# Patient Record
Sex: Male | Born: 1955 | Race: Black or African American | Hispanic: No | Marital: Married | Smoking: Never smoker
Health system: Southern US, Community
[De-identification: ages and names within clinical notes are randomized; demographics above are authoritative.]

## PROBLEM LIST (undated history)

## (undated) DIAGNOSIS — I517 Cardiomegaly: Secondary | ICD-10-CM

## (undated) DIAGNOSIS — R350 Frequency of micturition: Secondary | ICD-10-CM

## (undated) DIAGNOSIS — I10 Essential (primary) hypertension: Secondary | ICD-10-CM

## (undated) DIAGNOSIS — T7840XA Allergy, unspecified, initial encounter: Secondary | ICD-10-CM

## (undated) HISTORY — DX: Frequency of micturition: R35.0

## (undated) HISTORY — PX: COLONOSCOPY: SHX174

## (undated) HISTORY — DX: Allergy, unspecified, initial encounter: T78.40XA

## (undated) HISTORY — DX: Cardiomegaly: I51.7

---

## 2006-06-06 ENCOUNTER — Ambulatory Visit: Payer: Self-pay | Admitting: Gastroenterology

## 2006-06-24 ENCOUNTER — Ambulatory Visit: Payer: Self-pay | Admitting: Gastroenterology

## 2006-10-28 ENCOUNTER — Emergency Department (HOSPITAL_COMMUNITY): Admission: EM | Admit: 2006-10-28 | Discharge: 2006-10-29 | Payer: Self-pay | Admitting: Emergency Medicine
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
## 2008-01-10 ENCOUNTER — Emergency Department (HOSPITAL_COMMUNITY): Admission: EM | Admit: 2008-01-10 | Discharge: 2008-01-11 | Payer: Self-pay | Admitting: Emergency Medicine

## 2011-03-02 NOTE — Letter (Signed)
June 06, 2006     Ora Salvetti  [address] [place]. [place], [place]  [address]   RE:  [name], Drew  MRN:  [id number]  /  DOB:  July 02, 1956   Dear [name];   It is my pleasure to have treated you recently as a new patient in my  office.  I appreciate your confidence and the opportunity to participate in  your care.   Since I do have a busy inpatient endoscopy schedule and office schedule, my  office hours vary weekly.  I am, however, available for emergency calls  every day through my office.  If I cannot promptly meet an urgent office  appointment, another one of our gastroenterologists will be able to assist  you.   My well-trained staff are prepared to help you at all times.  For  emergencies after office hours, a physician from our gastroenterology  section is always available through my 24-hour answering service.   While you are under my care, I encourage discussion of your questions and  concerns, and I will be happy to return your calls as soon as I am  available.   Once again, I welcome you as a new patient and I look forward to a happy and  healthy relationship.   Sincerely,     Barbette Hair. Arlyce Dice, MD, [facility]   RDK/MedQ  DD:  06/06/2006 DT:  06/06/2006 Job #:  [id number]

## 2011-03-02 NOTE — Assessment & Plan Note (Signed)
[facility]                           GASTROENTEROLOGY OFFICE NOTE   ELIYOHU, CLASS                           MRN:          [id number]  DATE:06/06/2006                            DOB:          01/29/1956    REASON FOR CONSULTATION:  Rectal bleeding.   HISTORY:  Mr. Hilgert is a pleasant 55 year old African male referred through  the courtesy of Dr. Wylene Simmer for evaluation.  About three months ago, he had  approximately 10 days of rectal bleeding, consisting of bright red blood  mixed with the stools.  He had no abdominal or rectal pain at the time.  He  has had no recurrences.  He denies change of bowel habits or melena.   PAST MEDICAL HISTORY:  Pertinent for hypertension.   FAMILY HISTORY:  Noncontributory.   MEDICATIONS:  Toprol XL and Benicar.   ALLERGIES:  No known allergies.   SOCIAL HISTORY:  He does not smoke.  He drinks rarely.  He is single and  works at [employer].   REVIEW OF SYSTEMS:  Reviewed and is negative.   PHYSICAL EXAMINATION:  VITAL SIGNS:  Pulse 80, blood pressure 140/82, weight  165.  HEENT: EOMI. PERRLA. Sclerae are anicteric.  Conjunctivae are pink.  NECK:  Supple without thyromegaly, adenopathy or carotid bruits.  CHEST:  Clear to auscultation and percussion without adventitious sounds.  CARDIAC:  Regular rhythm; normal S1 S2.  There are no murmurs, gallops or  rubs.  ABDOMEN:  Bowel sounds are normoactive.  Abdomen is soft, non-tender and non-  distended.  There are no abdominal masses, tenderness, splenic enlargement  or hepatomegaly.  EXTREMITIES:  Full range of motion.  No cyanosis, clubbing or edema.  RECTAL:  Deferred.   IMPRESSION:  Limited episode of rectal bleeding.  Symptoms are certainly  compatible with hemorrhoidal bleeding, though more proximal colonic bleeding  source should be ruled out.   RECOMMENDATIONS:  Colonoscopy.                                   Barbette Hair. Arlyce Dice, MD, [facility]    RDK/MedQ  DD:  06/06/2006 DT:  06/06/2006 Job #:  [id number]   cc:   Gaspar Garbe, MD

## 2011-03-02 NOTE — Letter (Signed)
June 06, 2006     Gaspar Garbe, M.D.  [address]  [place], [place] [address]   RE:  Drew [name], Drew [name]  MRN:  [id number]  /  DOB:  [birth year]   Dear Dr. Wylene Simmer;   Upon your kind referral, I had the pleasure of evaluating your patient and I  am pleased to offer my findings.  I saw Mr. Dobransky in the office today.  Enclosed is a copy of my progress note that details my findings and  recommendations.   Thank you for the opportunity to participate in your patient's care.    Sincerely,      Barbette Hair. Arlyce Dice, MD, [facility]   RDK/MedQ  DD:  06/06/2006  DT:  06/06/2006  Job #:  [id number]

## 2011-07-09 LAB — DIFFERENTIAL
Basophils Absolute: 0
Basophils Relative: 0
Lymphocytes Relative: 19
Monocytes Relative: 8
Neutro Abs: 4.7

## 2011-07-09 LAB — BASIC METABOLIC PANEL
BUN: 14
CO2: 28
Calcium: 9.7
Chloride: 99
Creatinine, Ser: 1.2
GFR calc Af Amer: >60
GFR calc non Af Amer: >60
Glucose, Bld: 127 — ABNORMAL HIGH
Potassium: 3.8
Sodium: 137

## 2011-07-09 LAB — CBC
HCT: 49.5
Platelets: 141 — ABNORMAL LOW
RDW: 13.7

## 2013-06-24 ENCOUNTER — Encounter (HOSPITAL_COMMUNITY): Payer: Self-pay | Admitting: Emergency Medicine

## 2013-06-24 ENCOUNTER — Emergency Department (HOSPITAL_COMMUNITY)
Admission: EM | Admit: 2013-06-24 | Discharge: 2013-06-24 | Disposition: A | Discharge status: Home / Self Care | Payer: BC Managed Care – PPO | Attending: Emergency Medicine | Admitting: Emergency Medicine

## 2013-06-24 DIAGNOSIS — Z79899 Other long term (current) drug therapy: Secondary | ICD-10-CM | POA: Insufficient documentation

## 2013-06-24 DIAGNOSIS — I10 Essential (primary) hypertension: Secondary | ICD-10-CM | POA: Insufficient documentation

## 2013-06-24 DIAGNOSIS — Z7982 Long term (current) use of aspirin: Secondary | ICD-10-CM | POA: Insufficient documentation

## 2013-06-24 DIAGNOSIS — M25519 Pain in unspecified shoulder: Secondary | ICD-10-CM | POA: Insufficient documentation

## 2013-06-24 DIAGNOSIS — M25511 Pain in right shoulder: Secondary | ICD-10-CM

## 2013-06-24 HISTORY — DX: Essential (primary) hypertension: I10

## 2013-06-24 MED ORDER — HYDROCODONE-ACETAMINOPHEN 5-325 MG PO TABS: 1.0000 | ORAL_TABLET | Freq: Four times a day (QID) | ORAL | Status: DC | PRN

## 2013-06-24 NOTE — ED Provider Notes (Signed)
CSN: [id number]     Arrival date & time 06/24/13  0946 History   First MD Initiated Contact with Patient 06/24/13 [phone]     Chief Complaint  Patient presents with  . Shoulder Pain   (Consider location/radiation/quality/duration/timing/severity/associated sxs/prior Treatment) HPI Comments: 57 year old male presents to the emergency department complaining of right shoulder pain x1 week. Patient states one week ago he began to have pain towards the right side of the back of his neck and near his scapula through his shoulder radiating down to his elbow. 3 weeks ago he was pushing something heavy. Over the weekend he states he was jogging and made the pain worse. He saw his PCP yesterday, had x-rays and was given tramadol and Robaxin which he took with minimal relief. Patient states it difficult time getting comfortable last night to sleep. Denies chest pain. States he has some numbness in his right pinky finger.  Patient is a 57 y.o. male presenting with shoulder pain. The history is provided by the patient.  Shoulder Pain Pertinent negatives include no chest pain.    Past Medical History  Diagnosis Date  . Hypertension    History reviewed. No pertinent past surgical history. History reviewed. No pertinent family history. History  Substance Use Topics  . Smoking status: Never Smoker   . Smokeless tobacco: Not on file  . Alcohol Use: Yes     Comment: occasional    Review of Systems  Cardiovascular: Negative for chest pain.  Musculoskeletal:       Positive for right shoulder pain.  All other systems reviewed and are negative.    Allergies  Review of patient's allergies indicates no known allergies.  Home Medications   Current Outpatient Rx  Name  Route  Sig  Dispense  Refill  . aspirin 81 MG chewable tablet   Oral   Chew 81 mg by mouth daily.         Marland Kitchen CARVEDILOL PO   Oral   Take 1 tablet by mouth daily.         . methocarbamol (ROBAXIN) 500 MG tablet   Oral   Take  500 mg by mouth every 8 (eight) hours as needed (spasm).         . Olmesartan Medoxomil (BENICAR PO)   Oral   Take 1 tablet by mouth daily.         . traMADol (ULTRAM) 50 MG tablet   Oral   Take 50 mg by mouth every 8 (eight) hours as needed for pain.         Marland Kitchen HYDROcodone-acetaminophen (NORCO/VICODIN) 5-325 MG per tablet   Oral   Take 1-2 tablets by mouth every 6 (six) hours as needed for pain.   10 tablet   0    BP 174/104  Pulse 62  Temp(Src) 97.9 F (36.6 C) (Oral)  Resp 20  Ht 5\' 7"  (1.702 m)  Wt 162 lb (73.483 kg)  BMI 25.37 kg/m2  SpO2 100% Physical Exam  Nursing note and vitals reviewed. Constitutional: He is oriented to person, place, and time. He appears well-developed and well-nourished. No distress.  HENT:  Head: Normocephalic and atraumatic.  Mouth/Throat: Oropharynx is clear and moist.  Eyes: Conjunctivae are normal.  Neck: Normal range of motion. Neck supple.  Cardiovascular: Normal rate, regular rhythm, normal heart sounds and intact distal pulses.   Pulmonary/Chest: Effort normal and breath sounds normal.  Musculoskeletal: He exhibits no edema.  Tender to palpation of posterior right scapula, right trapezius and  throughout right shoulder girdle. Decreased range of motion with abduction limited by pain. Positive impingement sign. Strength upper extremity 5 out of 5 and equal bilateral.  Neurological: He is alert and oriented to person, place, and time.  Sensation intact.  Skin: Skin is warm and dry. He is not diaphoretic.  Psychiatric: He has a normal mood and affect. His behavior is normal.    ED Course  Procedures (including critical care time) Labs Review Labs Reviewed - No data to display Imaging Review No results found.  MDM   1. Shoulder pain, right    Patient with right shoulder pain. Possible rotator cuff injury. X-rays were normal at his PCPs office yesterday. I will give a sling for comfort, Vicodin for pain. Orthopedic followup.  Regarding patient's blood pressure, states he is very anxious about his shoulder and in pain. He has taken his blood pressure medications today. Repeat blood pressure 146/91. He is stable for discharge. Return precautions discussed. Patient states understanding of plan and is agreeable.  Trevor Mace, PA-C 06/24/13 1039

## 2013-06-24 NOTE — ED Provider Notes (Signed)
Medical screening examination/treatment/procedure(s) were performed by non-physician practitioner and as supervising physician I was immediately available for consultation/collaboration.  Fabianna Keats L Ameris Akamine, MD 06/24/13 1558 

## 2013-06-24 NOTE — ED Notes (Signed)
Pt reports pain in right side of posterior neck for the last week. Pt reports normal activity over the weekend. Pt reports on Monday began with pain from posterior shoulder to elbow. States seen by pcp yesterday had xray and medicines. Pt reports pain unrelieved. Difficulty sleeping last night.

## 2014-06-24 ENCOUNTER — Encounter: Payer: Self-pay | Admitting: Cardiology

## 2014-06-24 ENCOUNTER — Ambulatory Visit (INDEPENDENT_AMBULATORY_CARE_PROVIDER_SITE_OTHER): Payer: BC Managed Care – PPO | Admitting: Cardiology

## 2014-06-24 VITALS — BP 128/82 | HR 70 | Ht 67.0 in | Wt 165.6 lb

## 2014-06-24 DIAGNOSIS — I10 Essential (primary) hypertension: Secondary | ICD-10-CM | POA: Insufficient documentation

## 2014-06-24 NOTE — Patient Instructions (Signed)
Continue your current therapy  Get plenty of exercise   I will see you as needed.

## 2014-06-24 NOTE — Progress Notes (Signed)
Aquarius Latouche Date of Birth: 1956-02-09 Medical Record #[id number]  History of Present Illness: Mr. Routon is seen today at the request of Dr. Wylene Simmer. He is a 58 yo BM with history of HTN. He is not really sure why he is here today and I have no records. He denies any chest pain. He does note that sometimes he awakens and is breathing a little faster than at other times. Really denies SOB. Denies palpitations, dizziness, or edema. BP has been well controlled. Thinks he was told his heart was enlarged in the past but unclear how this was noted. No other cardiac risk factors. No known prior cardiac evaluation. States he exercises sporadically including jogging without any problems.    Medication List       This list is accurate as of: 06/24/14  7:28 PM.  Always use your most recent med list.               aspirin 81 MG chewable tablet  Chew 81 mg by mouth as needed for moderate pain or headache.     BENICAR PO  Take 1 tablet by mouth daily.     CARVEDILOL PO  Take 1 tablet by mouth daily.     HYDROcodone-acetaminophen 5-325 MG per tablet  Commonly known as:  NORCO/VICODIN  Take 1-2 tablets by mouth every 6 (six) hours as needed for pain.     methocarbamol 500 MG tablet  Commonly known as:  ROBAXIN  Take 500 mg by mouth every 8 (eight) hours as needed (spasm).     traMADol 50 MG tablet  Commonly known as:  ULTRAM  Take 50 mg by mouth every 8 (eight) hours as needed for pain.        No Known Allergies  Past Medical History  Diagnosis Date  . Hypertension     History reviewed. No pertinent past surgical history.  History   Social History  . Marital Status: Married    Spouse Name: N/A    Number of Children: 3  . Years of Education: N/A   Occupational History  . 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 Smoking status: Never Smoker   . Smokeless tobacco: None  . Alcohol Use: Yes     Comment: occasional  . Drug Use: No  . Sexual  Activity: None   Other Topics Concern  . None   Social History Narrative  . None    History reviewed. No pertinent family history.  Review of Systems: As noted in HPI.  All other systems were reviewed and are negative.  Physical Exam: BP 128/82  Pulse 70  Ht  (1.702 m)  Wt 165 lb 9.6 oz (75.116 kg)  BMI 25.93 kg/m2 Filed Weights   06/24/14 0946  Weight: 165 lb 9.6 oz (75.116 kg)  GENERAL:  Well appearing BM in NAD HEENT:  PERRL, EOMI, sclera are clear. Oropharynx is clear. NECK:  No jugular venous distention, carotid upstroke brisk and symmetric, no bruits, no thyromegaly or adenopathy LUNGS:  Clear to auscultation bilaterally CHEST:  Unremarkable HEART:  RRR,  PMI not displaced or sustained,S1 and S2 within normal limits, no S3, no S4: no clicks, no rubs, no murmurs ABD:  Soft, nontender. BS +, no masses or bruits. No hepatomegaly, no splenomegaly EXT:  2 + pulses throughout, no edema, no cyanosis no clubbing SKIN:  Warm and dry.  No rashes NEURO:  Alert and oriented x 3. Cranial nerves II through  XII intact. PSYCH:  Cognitively intact    LABORATORY DATA: Ecg: NSR rate 70 bpm. Normal.   Assessment / Plan: 1. HTN controlled on current medication. Continue  2. Vague sensation of breathing fast occasionally. Normal cardiac exam. Normal Ecg. No other cardiac symptoms. I don't think there is any evidence of overt cardiac disease. I would not recommend further work up. Recommend lifestyle modification with healthy diet and regular exercise.

## 2017-01-15 DIAGNOSIS — Z0000 Encounter for general adult medical examination without abnormal findings: Secondary | ICD-10-CM | POA: Diagnosis not present

## 2017-01-22 DIAGNOSIS — Z0000 Encounter for general adult medical examination without abnormal findings: Secondary | ICD-10-CM | POA: Diagnosis not present

## 2017-01-22 DIAGNOSIS — I10 Essential (primary) hypertension: Secondary | ICD-10-CM | POA: Diagnosis not present

## 2017-01-22 DIAGNOSIS — Z1389 Encounter for screening for other disorder: Secondary | ICD-10-CM | POA: Diagnosis not present

## 2017-01-22 DIAGNOSIS — J302 Other seasonal allergic rhinitis: Secondary | ICD-10-CM | POA: Diagnosis not present

## 2017-01-24 ENCOUNTER — Encounter: Payer: Self-pay | Admitting: Gastroenterology

## 2017-02-04 DIAGNOSIS — Z1212 Encounter for screening for malignant neoplasm of rectum: Secondary | ICD-10-CM | POA: Diagnosis not present

## 2017-03-13 ENCOUNTER — Ambulatory Visit (AMBULATORY_SURGERY_CENTER): Payer: Self-pay | Admitting: *Deleted

## 2017-03-13 VITALS — Ht 67.0 in | Wt 162.0 lb

## 2017-03-13 DIAGNOSIS — Z1211 Encounter for screening for malignant neoplasm of colon: Secondary | ICD-10-CM

## 2017-03-13 MED ORDER — NA SULFATE-K SULFATE-MG SULF 17.5-3.13-1.6 GM/177ML PO SOLN: 1.0000 | Freq: Once | ORAL | 0 refills | Status: AC

## 2017-03-13 NOTE — Progress Notes (Signed)
No egg or soy allergy known to patient  No issues with past sedation with any surgeries  or procedures, no past  intubation  No diet pills per patient No home 02 use per patient  No blood thinners per patient  Pt denies issues with constipation  No A fib or A flutter  EMMI video sent to pt's e mail  $15 coupon to pt for suprep

## 2017-03-14 ENCOUNTER — Encounter: Payer: Self-pay | Admitting: Gastroenterology

## 2017-03-26 ENCOUNTER — Encounter: Payer: Self-pay | Admitting: Gastroenterology

## 2017-03-26 ENCOUNTER — Ambulatory Visit (AMBULATORY_SURGERY_CENTER): Payer: 59 | Admitting: Gastroenterology

## 2017-03-26 VITALS — BP 120/79 | HR 66 | Temp 97.3°F | Resp 17 | Ht 67.0 in | Wt 162.0 lb

## 2017-03-26 DIAGNOSIS — Z1211 Encounter for screening for malignant neoplasm of colon: Secondary | ICD-10-CM | POA: Diagnosis present

## 2017-03-26 DIAGNOSIS — Z1212 Encounter for screening for malignant neoplasm of rectum: Secondary | ICD-10-CM | POA: Diagnosis not present

## 2017-03-26 MED ORDER — SODIUM CHLORIDE 0.9 % IV SOLN: 500.0000 mL | INTRAVENOUS | Status: DC

## 2017-03-26 NOTE — Progress Notes (Signed)
Alert and oriented x3, pleased with MAC, report to RN Jane 

## 2017-03-26 NOTE — Patient Instructions (Signed)
YOU HAD AN ENDOSCOPIC PROCEDURE TODAY AT THE [facility]:   Refer to the procedure report that was given to you for any specific questions about what was found during the examination.  If the procedure report does not answer your questions, please call your gastroenterologist to clarify.  If you requested that your care partner not be given the details of your procedure findings, then the procedure report has been included in a sealed envelope for you to review at your convenience later.  YOU SHOULD EXPECT: Some feelings of bloating in the abdomen. Passage of more gas than usual.  Walking can help get rid of the air that was put into your GI tract during the procedure and reduce the bloating. If you had a lower endoscopy (such as a colonoscopy or flexible sigmoidoscopy) you may notice spotting of blood in your stool or on the toilet paper. If you underwent a bowel prep for your procedure, you may not have a normal bowel movement for a few days.  Please Note:  You might notice some irritation and congestion in your nose or some drainage.  This is from the oxygen used during your procedure.  There is no need for concern and it should clear up in a day or so.  SYMPTOMS TO REPORT IMMEDIATELY:   Following lower endoscopy (colonoscopy or flexible sigmoidoscopy):  Excessive amounts of blood in the stool  Significant tenderness or worsening of abdominal pains  Swelling of the abdomen that is new, acute  Fever of 100F or higher    For urgent or emergent issues, a gastroenterologist can be reached at any hour by calling [phone].   DIET:  We do recommend a small meal at first, but then you may proceed to your regular diet.  Drink plenty of fluids but you should avoid alcoholic beverages for 24 hours.  ACTIVITY:  You should plan to take it easy for the rest of today and you should NOT DRIVE or use heavy machinery until tomorrow (because of the sedation medicines used during the test).     FOLLOW UP: Our staff will call the number listed on your records the next business day following your procedure to check on you and address any questions or concerns that you may have regarding the information given to you following your procedure. If we do not reach you, we will leave a message.  However, if you are feeling well and you are not experiencing any problems, there is no need to return our call.  We will assume that you have returned to your regular daily activities without incident.  If any biopsies were taken you will be contacted by phone or by letter within the next 1-3 weeks.  Please call us at [phone] if you have not heard about the biopsies in 3 weeks.    SIGNATURES/CONFIDENTIALITY: You and/or your care partner have signed paperwork which will be entered into your electronic medical record.  These signatures attest to the fact that that the information above on your After Visit Summary has been reviewed and is understood.  Full responsibility of the confidentiality of this discharge information lies with you and/or your care-partner.   Resume medications. Information given on diverticulosis. 

## 2017-03-26 NOTE — Progress Notes (Signed)
Pt's states no medical or surgical changes since previsit or office visit. 

## 2017-03-26 NOTE — Op Note (Signed)
[facility] Patient Name: Drew [name] Procedure Date: 03/26/2017 11:43 AM MRN: [id number] Endoscopist: Sherilyn CooterHenry L. Myrtie Neitheranis , MD Age: 61 Referring MD:  Date of Birth: 06/05/1956 Gender: Male Account #: [id number] Procedure:                Colonoscopy Indications:              Screening for colorectal malignant neoplasm (no                            polyps on 06/2006 colonoscopy) Medicines:                Monitored Anesthesia Care Procedure:                Pre-Anesthesia Assessment:                           - Prior to the procedure, a History and Physical                            was performed, and patient medications and                            allergies were reviewed. The patient's tolerance of                            previous anesthesia was also reviewed. The risks                            and benefits of the procedure and the sedation                            options and risks were discussed with the patient.                            All questions were answered, and informed consent                            was obtained. Prior Anticoagulants: The patient has                            taken no previous anticoagulant or antiplatelet                            agents. ASA Grade Assessment: II - A patient with                            mild systemic disease. After reviewing the risks                            and benefits, the patient was deemed in                            satisfactory condition to undergo the procedure.  After obtaining informed consent, the colonoscope                            was passed under direct vision. Throughout the                            procedure, the patient's blood pressure, pulse, and                            oxygen saturations were monitored continuously. The                            Colonoscope was introduced through the anus and                            advanced to the the cecum, identified  by                            appendiceal orifice and ileocecal valve. The                            colonoscopy was performed without difficulty. The                            patient tolerated the procedure well. The quality                            of the bowel preparation was excellent. The                            ileocecal valve, appendiceal orifice, and rectum                            were photographed. The quality of the bowel                            preparation was evaluated using the BBPS [facility]                            Bowel Preparation Scale) with scores of: Right                            Colon = 3, Transverse Colon = 3 and Left Colon = 3                            (entire mucosa seen well with no residual staining,                            small fragments of stool or opaque liquid). The                            total BBPS score equals 9. The bowel preparation  used was SUPREP. Scope In: 11:53:54 AM Scope Out: 12:05:44 PM Scope Withdrawal Time: 0 hours 9 minutes 49 seconds  Total Procedure Duration: 0 hours 11 minutes 50 seconds  Findings:                 The perianal and digital rectal examinations were                            normal.                           Multiple medium-mouthed diverticula were found in                            the entire colon.                           The exam was otherwise without abnormality on                            direct and retroflexion views. Complications:            No immediate complications. Estimated Blood Loss:     Estimated blood loss: none. Impression:               - Diverticulosis in the entire examined colon.                           - The examination was otherwise normal on direct                            and retroflexion views.                           - No specimens collected. Recommendation:           - Patient has a contact number available for                             emergencies. The signs and symptoms of potential                            delayed complications were discussed with the                            patient. Return to normal activities tomorrow.                            Written discharge instructions were provided to the                            patient.                           - Resume previous diet.                           - Continue present medications.                           -  Repeat colonoscopy in 10 years for screening                            purposes. Odetta Forness L. Myrtie Neither, MD 03/26/2017 12:09:35 PM This report has been signed electronically.

## 2017-03-27 ENCOUNTER — Telehealth: Payer: Self-pay | Admitting: *Deleted

## 2017-03-27 NOTE — Telephone Encounter (Signed)
  Follow up Call-  Call back number 03/26/2017  Post procedure Call Back phone  # [phone]  Permission to leave phone message Yes  Some recent data might be hidden     Patient questions:  Do you have a fever, pain , or abdominal swelling? No. Pain Score  0 *  Have you tolerated food without any problems? Yes.    Have you been able to return to your normal activities? Yes.    Do you have any questions about your discharge instructions: Diet   No. Medications  No. Follow up visit  No.  Do you have questions or concerns about your Care? No.  Actions: * If pain score is 4 or above: No action needed, pain <4.

## 2017-07-25 DIAGNOSIS — I517 Cardiomegaly: Secondary | ICD-10-CM | POA: Diagnosis not present

## 2017-07-25 DIAGNOSIS — M5412 Radiculopathy, cervical region: Secondary | ICD-10-CM | POA: Diagnosis not present

## 2017-07-25 DIAGNOSIS — I10 Essential (primary) hypertension: Secondary | ICD-10-CM | POA: Diagnosis not present

## 2018-01-21 DIAGNOSIS — Z0000 Encounter for general adult medical examination without abnormal findings: Secondary | ICD-10-CM | POA: Diagnosis not present

## 2018-01-21 DIAGNOSIS — R82998 Other abnormal findings in urine: Secondary | ICD-10-CM | POA: Diagnosis not present

## 2018-01-28 DIAGNOSIS — I517 Cardiomegaly: Secondary | ICD-10-CM | POA: Diagnosis not present

## 2018-01-28 DIAGNOSIS — Z1389 Encounter for screening for other disorder: Secondary | ICD-10-CM | POA: Diagnosis not present

## 2018-01-28 DIAGNOSIS — Z0000 Encounter for general adult medical examination without abnormal findings: Secondary | ICD-10-CM | POA: Diagnosis not present

## 2018-01-28 DIAGNOSIS — I10 Essential (primary) hypertension: Secondary | ICD-10-CM | POA: Diagnosis not present

## 2018-02-04 DIAGNOSIS — Z1212 Encounter for screening for malignant neoplasm of rectum: Secondary | ICD-10-CM | POA: Diagnosis not present

## 2018-08-11 DIAGNOSIS — I10 Essential (primary) hypertension: Secondary | ICD-10-CM | POA: Diagnosis not present

## 2018-08-11 DIAGNOSIS — E663 Overweight: Secondary | ICD-10-CM | POA: Diagnosis not present

## 2018-08-11 DIAGNOSIS — I517 Cardiomegaly: Secondary | ICD-10-CM | POA: Diagnosis not present

## 2021-02-17 DIAGNOSIS — I517 Cardiomegaly: Secondary | ICD-10-CM | POA: Diagnosis not present

## 2021-02-17 DIAGNOSIS — Z1331 Encounter for screening for depression: Secondary | ICD-10-CM | POA: Diagnosis not present

## 2021-02-17 DIAGNOSIS — Z1339 Encounter for screening examination for other mental health and behavioral disorders: Secondary | ICD-10-CM | POA: Diagnosis not present

## 2021-02-17 DIAGNOSIS — I10 Essential (primary) hypertension: Secondary | ICD-10-CM | POA: Diagnosis not present

## 2021-02-17 DIAGNOSIS — D696 Thrombocytopenia, unspecified: Secondary | ICD-10-CM | POA: Diagnosis not present

## 2021-08-29 DIAGNOSIS — Z125 Encounter for screening for malignant neoplasm of prostate: Secondary | ICD-10-CM | POA: Diagnosis not present

## 2021-08-29 DIAGNOSIS — I10 Essential (primary) hypertension: Secondary | ICD-10-CM | POA: Diagnosis not present

## 2021-09-05 DIAGNOSIS — Z23 Encounter for immunization: Secondary | ICD-10-CM | POA: Diagnosis not present

## 2021-09-05 DIAGNOSIS — I10 Essential (primary) hypertension: Secondary | ICD-10-CM | POA: Diagnosis not present

## 2021-09-05 DIAGNOSIS — Z0000 Encounter for general adult medical examination without abnormal findings: Secondary | ICD-10-CM | POA: Diagnosis not present

## 2021-09-05 DIAGNOSIS — Z1331 Encounter for screening for depression: Secondary | ICD-10-CM | POA: Diagnosis not present

## 2021-09-05 DIAGNOSIS — Z1339 Encounter for screening examination for other mental health and behavioral disorders: Secondary | ICD-10-CM | POA: Diagnosis not present

## 2021-09-06 DIAGNOSIS — Z1212 Encounter for screening for malignant neoplasm of rectum: Secondary | ICD-10-CM | POA: Diagnosis not present

## 2021-10-27 DIAGNOSIS — R350 Frequency of micturition: Secondary | ICD-10-CM | POA: Diagnosis not present

## 2021-10-27 DIAGNOSIS — R351 Nocturia: Secondary | ICD-10-CM | POA: Diagnosis not present

## 2022-03-14 DIAGNOSIS — D696 Thrombocytopenia, unspecified: Secondary | ICD-10-CM | POA: Diagnosis not present

## 2022-03-14 DIAGNOSIS — Z1339 Encounter for screening examination for other mental health and behavioral disorders: Secondary | ICD-10-CM | POA: Diagnosis not present

## 2022-03-14 DIAGNOSIS — I10 Essential (primary) hypertension: Secondary | ICD-10-CM | POA: Diagnosis not present

## 2022-03-14 DIAGNOSIS — Z1331 Encounter for screening for depression: Secondary | ICD-10-CM | POA: Diagnosis not present

## 2022-09-04 DIAGNOSIS — I10 Essential (primary) hypertension: Secondary | ICD-10-CM | POA: Diagnosis not present

## 2022-09-04 DIAGNOSIS — Z125 Encounter for screening for malignant neoplasm of prostate: Secondary | ICD-10-CM | POA: Diagnosis not present

## 2022-09-26 DIAGNOSIS — R82998 Other abnormal findings in urine: Secondary | ICD-10-CM | POA: Diagnosis not present

## 2022-09-26 DIAGNOSIS — Z0000 Encounter for general adult medical examination without abnormal findings: Secondary | ICD-10-CM | POA: Diagnosis not present

## 2022-09-26 DIAGNOSIS — I10 Essential (primary) hypertension: Secondary | ICD-10-CM | POA: Diagnosis not present

## 2022-09-26 DIAGNOSIS — Z1339 Encounter for screening examination for other mental health and behavioral disorders: Secondary | ICD-10-CM | POA: Diagnosis not present

## 2022-09-26 DIAGNOSIS — Z1331 Encounter for screening for depression: Secondary | ICD-10-CM | POA: Diagnosis not present

## 2022-10-05 DIAGNOSIS — Z1212 Encounter for screening for malignant neoplasm of rectum: Secondary | ICD-10-CM | POA: Diagnosis not present

## 2022-10-17 ENCOUNTER — Emergency Department (HOSPITAL_BASED_OUTPATIENT_CLINIC_OR_DEPARTMENT_OTHER)
Admission: EM | Admit: 2022-10-17 | Discharge: 2022-10-17 | Disposition: A | Discharge status: Home / Self Care | Payer: BC Managed Care – PPO | Attending: Emergency Medicine | Admitting: Emergency Medicine

## 2022-10-17 ENCOUNTER — Encounter (HOSPITAL_BASED_OUTPATIENT_CLINIC_OR_DEPARTMENT_OTHER): Payer: Self-pay

## 2022-10-17 ENCOUNTER — Other Ambulatory Visit: Payer: Self-pay

## 2022-10-17 ENCOUNTER — Emergency Department (HOSPITAL_BASED_OUTPATIENT_CLINIC_OR_DEPARTMENT_OTHER): Payer: BC Managed Care – PPO

## 2022-10-17 DIAGNOSIS — R519 Headache, unspecified: Secondary | ICD-10-CM | POA: Diagnosis not present

## 2022-10-17 NOTE — ED Provider Notes (Signed)
[facility] EMERGENCY DEPT Provider Note   CSN: [id number] Arrival date & time: 10/17/22  1802     History  Chief Complaint  Patient presents with   Headache    Drew [name] is a 67 y.o. male who was sent in for headache.  He has had 3 days of a right-sided headache primarily near the ethmoid sinus and around his right eye.  He was seen in urgent care earlier today and sent in for CT scan of the head to rule out any emergent cause.  He does not regularly get bad headaches.  He states his headache is actually much better but on New Year's Eve he started getting pain there and it seemed a lot worse at the time but has gotten much better.  He denies changes in vision, fever, chills, neck stiffness.  He has not had any sinus pain or pressure and does have some allergies.   Headache      Home Medications Prior to Admission medications   Medication Sig Start Date End Date Taking? Authorizing Provider  aspirin 81 MG chewable tablet Chew 81 mg by mouth as needed for moderate pain or headache.     [provider]  CARVEDILOL PO Take 1 tablet by mouth daily.    [provider]  irbesartan-hydrochlorothiazide (AVALIDE) 300-12.5 MG tablet Take 1 tablet by mouth daily.    [provider]      Allergies    Patient has no known allergies.    Review of Systems   Review of Systems  Neurological:  Positive for headaches.    Physical Exam Updated Vital Signs BP (!) 160/92 (BP Location: Right Arm)   Pulse 73   Temp 98.2 F (36.8 C) (Oral)   Resp 16   Ht 5\' 7"  (1.702 m)   Wt 73.5 kg   SpO2 98%   BMI 25.38 kg/m  Physical Exam Vitals and nursing note reviewed.  Constitutional:      General: He is not in acute distress.    Appearance: He is well-developed. He is not diaphoretic.  HENT:     Head: Normocephalic and atraumatic.  Eyes:     General: No scleral icterus.    Conjunctiva/sclera: Conjunctivae normal.     Pupils: Pupils are equal,  round, and reactive to light.     Comments: No horizontal, vertical or rotational nystagmus  Neck:     Comments: Full active and passive ROM without pain No midline or paraspinal tenderness No nuchal rigidity or meningeal signs Cardiovascular:     Rate and Rhythm: Normal rate and regular rhythm.     Heart sounds: Normal heart sounds.  Pulmonary:     Effort: Pulmonary effort is normal. No respiratory distress.     Breath sounds: Normal breath sounds. No wheezing or rales.  Abdominal:     General: Bowel sounds are normal.     Palpations: Abdomen is soft.     Tenderness: There is no abdominal tenderness. There is no guarding or rebound.  Musculoskeletal:        General: Normal range of motion.     Cervical back: Normal range of motion and neck supple.  Lymphadenopathy:     Cervical: No cervical adenopathy.  Skin:    General: Skin is warm and dry.     Findings: No rash.  Neurological:     Mental Status: He is alert and oriented to person, place, and time.     Cranial Nerves: No cranial nerve deficit.  Motor: No abnormal muscle tone.     Coordination: Coordination normal.     Deep Tendon Reflexes: Reflexes are normal and symmetric.     Comments: Mental Status:  Alert, oriented, thought content appropriate. Speech fluent without evidence of aphasia. Able to follow 2 step commands without difficulty.  Cranial Nerves:  II:  Peripheral visual fields grossly normal, pupils equal, round, reactive to light III,IV, VI: ptosis not present, extra-ocular motions intact bilaterally  V,VII: smile symmetric, facial light touch sensation equal VIII: hearing grossly normal bilaterally  IX,X: midline uvula rise  XI: bilateral shoulder shrug equal and strong XII: midline tongue extension  Motor:  5/5 in upper and lower extremities bilaterally including strong and equal grip strength and dorsiflexion/plantar flexion Sensory: Pinprick and light touch normal in all extremities.  Deep Tendon  Reflexes: 2+ and symmetric  Cerebellar: normal finger-to-nose with bilateral upper extremities Gait: normal gait and balance CV: distal pulses palpable throughout   Psychiatric:        Behavior: Behavior normal.        Thought Content: Thought content normal.        Judgment: Judgment normal.     ED Results / Procedures / Treatments   Labs (all labs ordered are listed, but only abnormal results are displayed) Labs Reviewed - No data to display  EKG None  Radiology CT Head Wo Contrast  Result Date: 10/17/2022 CLINICAL DATA:  Headache EXAM: CT HEAD WITHOUT CONTRAST TECHNIQUE: Contiguous axial images were obtained from the base of the skull through the vertex without intravenous contrast. RADIATION DOSE REDUCTION: This exam was performed according to the departmental dose-optimization program which includes automated exposure control, adjustment of the mA and/or kV according to patient size and/or use of iterative reconstruction technique. COMPARISON:  None Available. FINDINGS: Brain: No evidence of acute infarction, hemorrhage, hydrocephalus, extra-axial collection or mass lesion/mass effect. Vascular: No hyperdense vessel or unexpected calcification. Skull: Normal. Negative for fracture or focal lesion. Sinuses/Orbits: Moderate mucosal thickening in the right frontal and ethmoid sinuses. Mild mucosal thickening in the maxillary sinuses Other: None IMPRESSION: 1. Negative non contrasted CT appearance of the brain. 2. Paranasal sinus disease. Electronically Signed   By: Donavan Foil M.D.   On: 10/17/2022 20:58    Procedures Procedures    Medications Ordered in ED Medications - No data to display  ED Course/ Medical Decision Making/ A&P Clinical Course as of 10/17/22 2134  Wed Oct 17, 2022  2134 CT Head Wo Contrast I visualized interpreted CT head which shows no acute findings [AH]    Clinical Course User Index [AH] Margarita Mail, PA-C                           Medical Decision  Making Drew [name] presents with headache Given the large differential diagnosis for Drew [name], the decision making in this case is of high complexity.  After evaluating all of the data points in this case, the presentation of Drew [name] is NOT consistent with skull fracture, meningitis/encephalitis, SAH/sentinel bleed, Intracranial Hemorrhage (ICH) (subdural/epidural), acute obstructive hydrocephalus, space occupying lesions, CVA, CO Poisoning, Basilar/vertebral artery dissection, preeclampsia, cerebral venous thrombosis, hypertensive emergency, temporal Arteritis, Idiopathic Intracranial Hypertension (pseudotumor cerebri).  Strict return and follow-up precautions have been given by me personally or by detailed written instructions verbalized by nursing staff using the teach back method to patient/family/caregiver.  Data Reviewed/Counseling: I have reviewed the patient's vital signs, nursing notes, and other relevant tests/information. I  had a detailed discussion regarding the historical points, exam findings, and any diagnostic results supporting the discharge diagnosis. I also discussed the need for outpatient follow-up and the need to return to the ED if symptoms worsen or if there are any questions or concerns that arise at hom    Amount and/or Complexity of Data Reviewed Radiology: ordered and independent interpretation performed.           Final Clinical Impression(s) / ED Diagnoses Final diagnoses:  None    Rx / DC Orders ED Discharge Orders     None         Margarita Mail, PA-C 10/17/22 2134    Charlesetta Shanks, MD 10/24/22 [phone]

## 2022-10-17 NOTE — ED Triage Notes (Addendum)
Patient here POV from Home.  Endorses Headache for approximately 3 Days. States it has actually improved slightly since it began. No Trauma. Neurologically Intact. No N/V. No Photosensitivity. No Anticoagulants.   Sent by UC for Possible Imaging.   NAD Noted during Triage. A&Ox4. GCS 15. Ambulatory.

## 2022-10-17 NOTE — Discharge Instructions (Signed)

## 2022-11-09 DIAGNOSIS — R350 Frequency of micturition: Secondary | ICD-10-CM | POA: Diagnosis not present

## 2022-11-09 DIAGNOSIS — R351 Nocturia: Secondary | ICD-10-CM | POA: Diagnosis not present

## 2023-03-27 DIAGNOSIS — J302 Other seasonal allergic rhinitis: Secondary | ICD-10-CM | POA: Diagnosis not present

## 2023-03-27 DIAGNOSIS — I119 Hypertensive heart disease without heart failure: Secondary | ICD-10-CM | POA: Diagnosis not present

## 2023-06-13 DIAGNOSIS — R202 Paresthesia of skin: Secondary | ICD-10-CM | POA: Diagnosis not present

## 2023-06-26 DIAGNOSIS — M5412 Radiculopathy, cervical region: Secondary | ICD-10-CM | POA: Diagnosis not present

## 2023-09-26 DIAGNOSIS — I10 Essential (primary) hypertension: Secondary | ICD-10-CM | POA: Diagnosis not present

## 2023-09-26 DIAGNOSIS — Z125 Encounter for screening for malignant neoplasm of prostate: Secondary | ICD-10-CM | POA: Diagnosis not present

## 2023-10-03 DIAGNOSIS — Z0000 Encounter for general adult medical examination without abnormal findings: Secondary | ICD-10-CM | POA: Diagnosis not present

## 2023-10-03 DIAGNOSIS — I119 Hypertensive heart disease without heart failure: Secondary | ICD-10-CM | POA: Diagnosis not present

## 2023-10-03 DIAGNOSIS — Z23 Encounter for immunization: Secondary | ICD-10-CM | POA: Diagnosis not present

## 2023-10-03 DIAGNOSIS — I10 Essential (primary) hypertension: Secondary | ICD-10-CM | POA: Diagnosis not present

## 2023-10-03 DIAGNOSIS — Z1331 Encounter for screening for depression: Secondary | ICD-10-CM | POA: Diagnosis not present

## 2023-10-03 DIAGNOSIS — Z1212 Encounter for screening for malignant neoplasm of rectum: Secondary | ICD-10-CM | POA: Diagnosis not present

## 2023-10-03 DIAGNOSIS — Z1339 Encounter for screening examination for other mental health and behavioral disorders: Secondary | ICD-10-CM | POA: Diagnosis not present

## 2023-10-31 DIAGNOSIS — R350 Frequency of micturition: Secondary | ICD-10-CM | POA: Diagnosis not present

## 2023-10-31 DIAGNOSIS — R351 Nocturia: Secondary | ICD-10-CM | POA: Diagnosis not present

## 2023-12-25 ENCOUNTER — Ambulatory Visit: Payer: BC Managed Care – PPO | Attending: Cardiology | Admitting: Cardiology

## 2023-12-25 ENCOUNTER — Encounter: Payer: Self-pay | Admitting: Cardiology

## 2023-12-25 VITALS — BP 130/80 | HR 54 | Resp 16 | Ht 67.0 in | Wt 160.4 lb

## 2023-12-25 DIAGNOSIS — I10 Essential (primary) hypertension: Secondary | ICD-10-CM

## 2023-12-25 DIAGNOSIS — I517 Cardiomegaly: Secondary | ICD-10-CM | POA: Diagnosis not present

## 2023-12-25 LAB — TSH: TSH: 1.4 u[IU]/mL (ref 0.450–4.500)

## 2023-12-25 NOTE — Progress Notes (Signed)
 Cardiology Office Note:  .   Date:  12/25/2023  ID:  Drew [name], DOB 05/05/1956, MRN [id number] PCP: Gaspar Garbe, MD  [facility] Providers Cardiologist:  None   History of Present Illness: Drew [name] is a 68 y.o. Patient with primary hypertension, chronic thrombocythemia cytopenia remote echocardiogram had revealed LVH and mild cardiomegaly, patient is asymptomatic but in view of prior history of cardiomegaly noted in 2005, details not available he is referred back to cardiology.  Discussed the use of AI scribe software for clinical note transcription with the patient, who gave verbal consent to proceed.  History of Present Illness   The patient, with a history of hypertension and cardiomegaly, presents for a follow-up visit. He is asymptomatic, with no reported chest pain or shortness of breath. He has been compliant with his blood pressure medication. He engages in regular exercise, including walking and running at a local park. He has a history of alcohol consumption, but currently only consumes two to three drinks on weekends. He maintains a balanced diet, with occasional consumption of beef and pork, but a preference for fish, chicken, and [place]. He has no family history of heart disease.      Labs   No results found for: "TSH"  External Labs:  PCP faxed Labs 09/26/2023:  Total cholesterol 164, triglycerides 76, HDL 69, LDL 80.  Hb 15.2, platelets 129, normal indicis.  Serum glucose 103 mg, serum creatinine 1.1, EGFR 81 mL, potassium 3.8, LFTs normal.  Review of Systems  Cardiovascular:  Negative for chest pain, dyspnea on exertion and leg swelling.   Physical Exam:   VS:  BP 130/80 (BP Location: Left Arm, Patient Position: Sitting, Cuff Size: Normal)   Pulse (!) 54   Resp 16   Ht 5\' 7"  (1.702 m)   Wt 160 lb 6.4 oz (72.8 kg)   SpO2 97%   BMI 25.12 kg/m    Wt Readings from Last 3 Encounters:  12/25/23 160 lb 6.4 oz (72.8 kg)  10/17/22 162 lb 0.6 oz  (73.5 kg)  03/26/17 162 lb (73.5 kg)    Physical Exam Neck:     Vascular: No carotid bruit or JVD.  Cardiovascular:     Rate and Rhythm: Normal rate and regular rhythm.     Pulses: Intact distal pulses.     Heart sounds: Normal heart sounds. No murmur heard.    No gallop.  Pulmonary:     Effort: Pulmonary effort is normal.     Breath sounds: Normal breath sounds.  Abdominal:     General: Bowel sounds are normal.     Palpations: Abdomen is soft.  Musculoskeletal:     Right lower leg: No edema.     Left lower leg: No edema.    Studies Reviewed: Marland Kitchen     EKG:    EKG Interpretation Date/Time:  Wednesday December 25 2023 08:43:20 EDT Ventricular Rate:  56 PR Interval:  200 QRS Duration:  100 QT Interval:  396 QTC Calculation: 382 R Axis:   17  Text Interpretation: EKG 12/25/2023: Normal sinus rhythm at rate of 56 bpm, normal EKG.  Compared to 01/10/2008, nonspecific T wave inversion in leads 2 and 3 not evident Confirmed by Delrae Rend [phone]) on 12/25/2023 9:14:41 AM    Medications and allergies    No Known Allergies   Current Outpatient Medications:    aspirin 81 MG chewable tablet, Chew 81 mg by mouth as needed for moderate pain or headache. ,  Disp: , Rfl:    carvedilol (COREG) 25 MG tablet, Take 25 mg by mouth daily., Disp: , Rfl:    irbesartan-hydrochlorothiazide (AVALIDE) 300-12.5 MG tablet, Take 1 tablet by mouth daily., Disp: , Rfl:    Multiple Vitamin (MULTIVITAMIN) capsule, Take 1 capsule by mouth daily., Disp: , Rfl:    olmesartan-hydrochlorothiazide (BENICAR HCT) 40-12.5 MG tablet, Take 1 tablet by mouth daily., Disp: , Rfl:    oxybutynin (DITROPAN) 5 MG tablet, Take 5 mg by mouth at bedtime., Disp: , Rfl:    tamsulosin (FLOMAX) 0.4 MG CAPS capsule, Take 0.4 mg by mouth., Disp: , Rfl:    ASSESSMENT AND PLAN: .      ICD-10-CM   1. Cardiomegaly  I51.7 EKG 12-Lead    ECHOCARDIOGRAM LIMITED    TSH    2. Primary hypertension  I10 ECHOCARDIOGRAM LIMITED    TSH       Assessment and Plan    Cardiomegaly   Cardiomegaly, identified in 2005 with left ventricular hypertrophy and mild cardiomegaly on echocardiogram, remains asymptomatic with no chest pain or dyspnea. An echocardiogram is ordered to reassess the condition as requested by the referring physician. Results will be reviewed and followed up as needed.  Hypertension   Primary hypertension is well-controlled with carvedilol 25 mg once daily and to be certain HCT 300/12.5 mg in the morning. Blood pressure is well-managed, and he remains asymptomatic. Continue carvedilol and monitor blood pressure regularly.  Will check TSH in view of cardiomegaly and also hypertension.  Normal lipid status Lipid profile is within normal limits with total cholesterol at 164, triglycerides at 76, HDL at 69, and LDL at 80. Continue current lipid management and monitor lipid profile regularly.  No indication for statin.  Chronic essential thrombocytopenia   Chronic essential thrombocytopenia presents with a platelet count of 129, with no symptoms or complications. Monitor platelet count regularly.      I will see him back on a as needed basis.  Patient continues to exercise regularly, eat healthy, non-smoker, no family history of premature coronary disease.     Signed,  Yates Decamp, MD, [facility] 12/25/2023, 9:24 AM [facility] [address] [place], [place] [address] Phone: [phone]. Fax:  [phone]

## 2023-12-25 NOTE — Patient Instructions (Signed)
 Medication Instructions:  Your physician recommends that you continue on your current medications as directed. Please refer to the Current Medication list given to you today.  *If you need a refill on your cardiac medications before your next appointment, please call your pharmacy*  Lab Work: TODAY: TSH If you have labs (blood work) drawn today and your tests are completely normal, you will receive your results only by: MyChart Message (if you have MyChart) OR A paper copy in the mail If you have any lab test that is abnormal or we need to change your treatment, we will call you to review the results.  Testing/Procedures: Your physician has requested that you have a limited echocardiogram. Echocardiography is a painless test that uses sound waves to create images of your heart. It provides your doctor with information about the size and shape of your heart and how well your heart's chambers and valves are working. This procedure takes approximately one hour. There are no restrictions for this procedure. Please do NOT wear cologne, perfume, aftershave, or lotions (deodorant is allowed). Please arrive 15 minutes prior to your appointment time.  Please note: We ask at that you not bring children with you during ultrasound (echo/ vascular) testing. Due to room size and safety concerns, children are not allowed in the ultrasound rooms during exams. Our front office staff cannot provide observation of children in our lobby area while testing is being conducted. An adult accompanying a patient to their appointment will only be allowed in the ultrasound room at the discretion of the ultrasound technician under special circumstances. We apologize for any inconvenience.   Follow-Up: As needed  Other Instructions   1st Floor: - Lobby - Registration  - Pharmacy  - Lab - Cafe  2nd Floor: - PV Lab - Diagnostic Testing (echo, CT, nuclear med)  3rd Floor: - Vacant  4th Floor: - TCTS  (cardiothoracic surgery) - AFib Clinic - Structural Heart Clinic - Vascular Surgery  - Vascular Ultrasound  5th Floor: - HeartCare Cardiology (general and EP) - Clinical Pharmacy for coumadin, hypertension, lipid, weight-loss medications, and med management appointments    Valet parking services will be available as well.

## 2023-12-26 ENCOUNTER — Encounter: Payer: Self-pay | Admitting: *Deleted

## 2023-12-26 NOTE — Progress Notes (Signed)
 Normal thyroid function test.  No further evaluation is indicated.

## 2024-01-13 ENCOUNTER — Ambulatory Visit (HOSPITAL_COMMUNITY): Attending: Cardiology

## 2024-01-13 DIAGNOSIS — I10 Essential (primary) hypertension: Secondary | ICD-10-CM | POA: Diagnosis not present

## 2024-01-13 DIAGNOSIS — I517 Cardiomegaly: Secondary | ICD-10-CM | POA: Diagnosis not present

## 2024-01-13 LAB — ECHOCARDIOGRAM LIMITED
Area-P 1/2: 3.89 cm2
P 1/2 time: 724 ms
S' Lateral: 2.6 cm

## 2024-01-13 NOTE — Progress Notes (Signed)
 Normal LV function, moderate LVH.  There is no cardiomegaly.  Continue observation for now.  Patient was asymptomatic when I last saw him.  LVH = Left ventricular hypertrophy: thickening of heart muscle probably due to hypertension. Can happen in valve leaking issues, obesity, diabetes.  Sometimes atheletes or if you exercise daily then it is not abnormal.

## 2024-01-14 ENCOUNTER — Telehealth: Payer: Self-pay | Admitting: Cardiology

## 2024-01-14 NOTE — Telephone Encounter (Signed)
 Patient is returning Rn's call for his echo results. Please advise.

## 2024-01-14 NOTE — Telephone Encounter (Signed)
I spoke with patient and reviewed echo results with him

## 2024-01-14 NOTE — Telephone Encounter (Signed)
-----   Message from Drew [name] sent at 01/13/2024  4:58 PM EDT ----- Normal LV function, moderate LVH.  There is no cardiomegaly.  Continue observation for now.  Patient was asymptomatic when I last saw him.  LVH = Left ventricular hypertrophy: thickening of heart muscle probably due to hypertension. Can happen in valve leaking issues, obesity, diabetes.  Sometimes atheletes or if you exercise daily then it is not abnormal.

## 2024-04-15 DIAGNOSIS — I119 Hypertensive heart disease without heart failure: Secondary | ICD-10-CM | POA: Diagnosis not present
